# Patient Record
Sex: Female | Born: 1985 | Race: White | Hispanic: No
Health system: Southern US, Community
[De-identification: ages and names within clinical notes are randomized; demographics above are authoritative.]

---

## 2016-12-20 DIAGNOSIS — E785 Hyperlipidemia, unspecified: Secondary | ICD-10-CM | POA: Insufficient documentation

## 2017-03-18 ENCOUNTER — Ambulatory Visit (INDEPENDENT_AMBULATORY_CARE_PROVIDER_SITE_OTHER): Payer: Medicaid Other | Admitting: Licensed Clinical Social Worker

## 2017-03-18 DIAGNOSIS — F4310 Post-traumatic stress disorder, unspecified: Secondary | ICD-10-CM | POA: Diagnosis not present

## 2017-03-19 DIAGNOSIS — F4310 Post-traumatic stress disorder, unspecified: Secondary | ICD-10-CM | POA: Insufficient documentation
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
## 2017-04-04 ENCOUNTER — Ambulatory Visit (INDEPENDENT_AMBULATORY_CARE_PROVIDER_SITE_OTHER): Payer: Medicaid Other | Admitting: Licensed Clinical Social Worker

## 2017-04-04 DIAGNOSIS — F4310 Post-traumatic stress disorder, unspecified: Secondary | ICD-10-CM | POA: Diagnosis not present

## 2017-04-04 NOTE — Progress Notes (Signed)
   THERAPIST PROGRESS NOTE  Session Time: 9:07am-9:58am  Participation Level: Active  Behavioral Response: CasualAlertDysphoric  Type of Therapy: Individual Therapy  Treatment Goals addressed: Improve emotion regulation, increase enjoyment of activities  Interventions: Treatment planning, assessment, solution focused  Suicidal/Homicidal: Denied both  Therapist Interventions:  Had patient complete [facility] PCL-5 to assess for presence and severity of PTSD-related symptoms. Collaborated with patient to develop her treatment plan.  Briefly described interventions she can expect as she participates in therapy. Validated feelings of distress regarding having unexpected problems in her relationship with her fiance.  Discussed how it seems as though he is not being upfront with her about his thoughts and feelings.   Patient indicated she is afraid of her boyfriend kicking her out.  She recently quit working at [employer] job she liked because he asked her to stop working.  Provided her with information about [facility] [place] as [facility] place she can turn to for guidance with finding resources if she needs them.       Summary:  Patient had her two children with her today, Hale 29 year old and Hale baby.  She explained that the daycare they had been going to is no longer in operation.  Hasn't figured out an alternative yet.    Score on the PCL-5 was 31.  Rated three items as bothering her "quite Hale bit" in the past month: Trouble remembering important parts of traumatic events, feeling distant or cut off from others, and trouble falling or staying asleep.     Developed the following treatment goals: Amber Smilingatasha will report feeling satisfied with how she is coping with distressing thoughts and feelings.  She will also report getting some enjoyment from activities.    Patient reported that last night she and her fiance had an argument.  He apparently told her he wanted to separate.  She was very confused  because she was under the impression that everything between them was fine.  She noted that he has Hale history of problems with his mood and even attempted suicide once.  She asked him if he was bipolar.  He told her he didn't want to talk about that.  She wonders if he may need some mental health treatment.  She wasn't able to go into great detail about her concerns because of her 29 year old being present.       Plan: Scheduled to return next week.  Says she is going to work on getting child care.  Diagnosis: PTSD    Darrin LuisSolomon, Amber Sample A, LCSW 04/04/2017

## 2017-04-11 ENCOUNTER — Ambulatory Visit (INDEPENDENT_AMBULATORY_CARE_PROVIDER_SITE_OTHER): Payer: Medicaid Other | Admitting: Licensed Clinical Social Worker

## 2017-04-11 DIAGNOSIS — F4310 Post-traumatic stress disorder, unspecified: Secondary | ICD-10-CM | POA: Diagnosis not present

## 2017-04-14 NOTE — Progress Notes (Signed)
   THERAPIST PROGRESS NOTE  Session Time: 9:05am-9:52am  Participation Level: Active  Behavioral Response: CasualAlertDysphoric Tearful  Type of Therapy: Individual Therapy  Treatment Goals addressed: Improve emotion regulation, increase enjoyment of activities  Interventions: Assessment, solution focused  Suicidal/Homicidal: Denied both  Therapist Interventions:  Discussed concerns patient has about some drastic changes in her fiance's behavior in the past month.  Explored how she might reach out to his parents to talk about these concerns.  Encouraged patient not to blame herself for his behavior.  Educated patient about a relaxation exercise called the 4-7-8 breath.  Provided her with a handout describing the technique.  Recommended practicing it at least once a day.       Summary:   Patient had 3 young children with her because she still does not have a babysitter.  With them present she was not able to fully express her emotions.  She did become tearful but tried to hide this from her daughter.  Reported that her fiance now says he doesn't want to be in a relationship with her anymore.  He became mad at her when she called him out on talking to other women online.  Noted that he has cheated on her twice before.   Expressed concern about the fact that he has been going out drinking with friends on the weekends, he stopped going to church, and stopped talking to his dad.  Meanwhile patient reports not eating in 6 days, waking up in the middle of the night in a panic, and she started smoking again.  Planning to talk at length with fiance's dad because she is hoping he will be able to get his son back on track.      Plan: Scheduled to return Aug 13th.    Diagnosis: PTSD    Darrin LuisSolomon, Sarah A, LCSW 04/11/2017

## 2017-04-21 ENCOUNTER — Ambulatory Visit (HOSPITAL_COMMUNITY): Payer: Medicaid Other | Admitting: Licensed Clinical Social Worker

## 2017-05-02 ENCOUNTER — Ambulatory Visit (HOSPITAL_COMMUNITY): Payer: Medicaid Other | Admitting: Licensed Clinical Social Worker

## 2019-06-29 ENCOUNTER — Ambulatory Visit
Admission: RE | Admit: 2019-06-29 | Discharge: 2019-06-29 | Disposition: A | Discharge status: Home / Self Care | Payer: No Typology Code available for payment source | Source: Ambulatory Visit | Attending: Nurse Practitioner | Admitting: Nurse Practitioner

## 2019-06-29 ENCOUNTER — Other Ambulatory Visit: Payer: Self-pay | Admitting: Nurse Practitioner

## 2019-06-29 DIAGNOSIS — M25511 Pain in right shoulder: Secondary | ICD-10-CM

## 2020-09-19 IMAGING — CR DG SHOULDER 2+V*R*
3 series · 3 of 3 positions shown · non-contrast
Comparison: None.

CLINICAL DATA: Right shoulder pain

EXAM:
RIGHT SHOULDER - 2+ VIEW

[w shoulder grashey right]
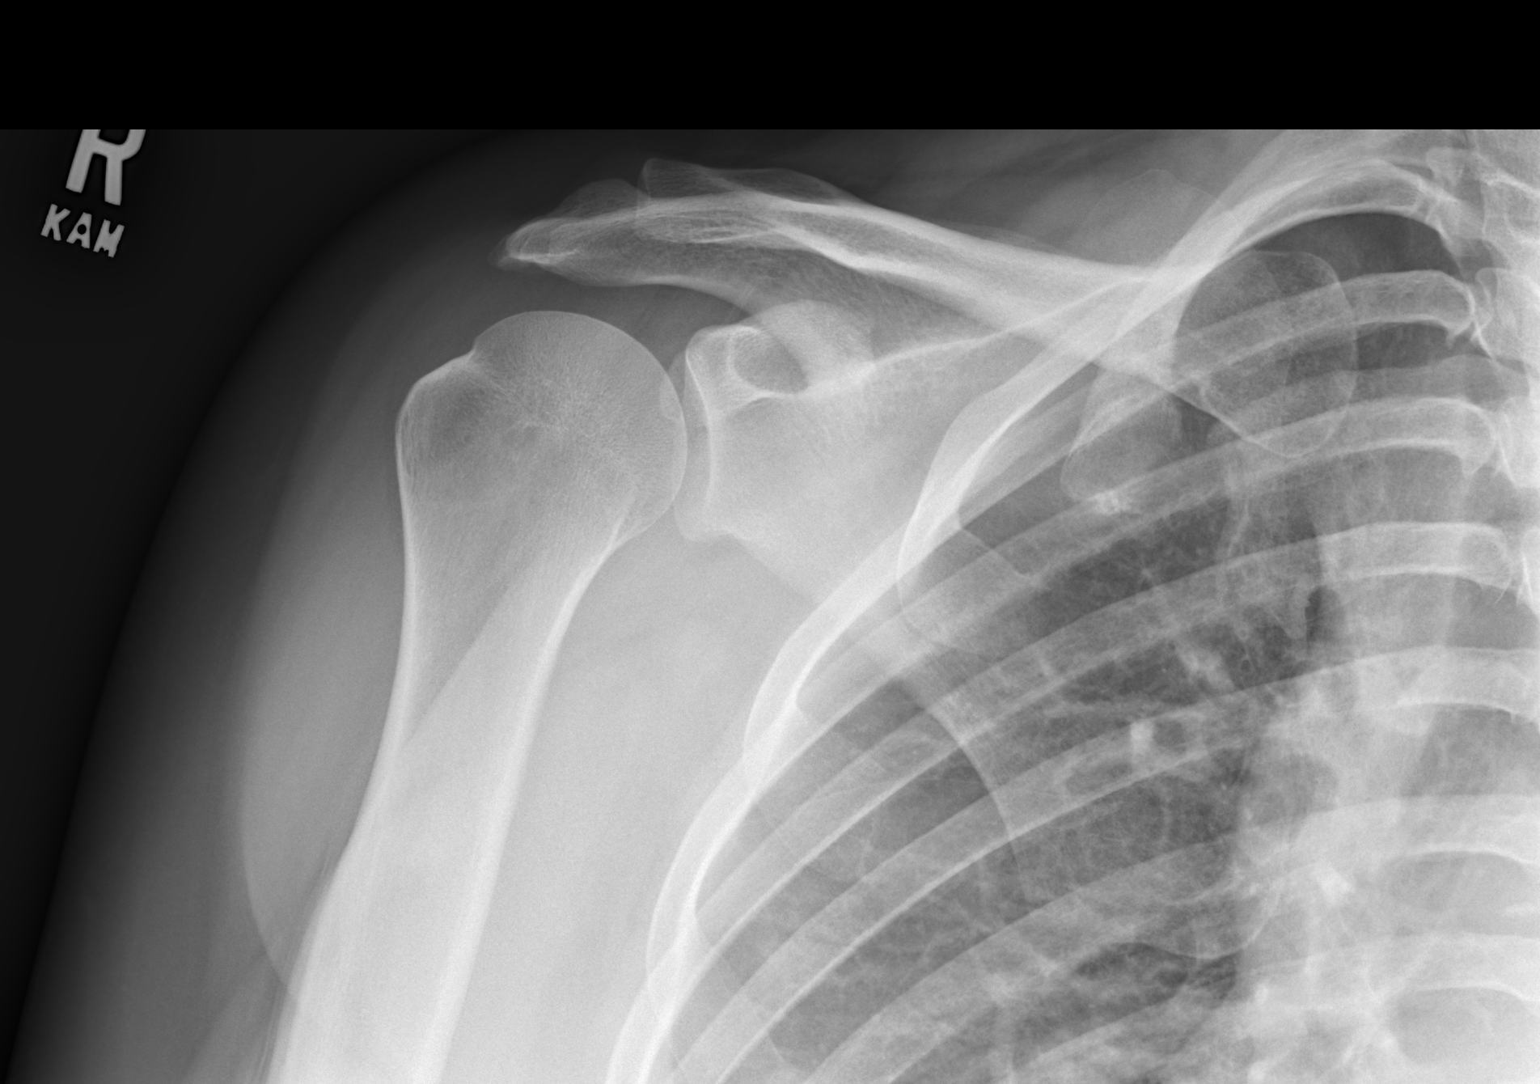

[w shoulder y-view right]
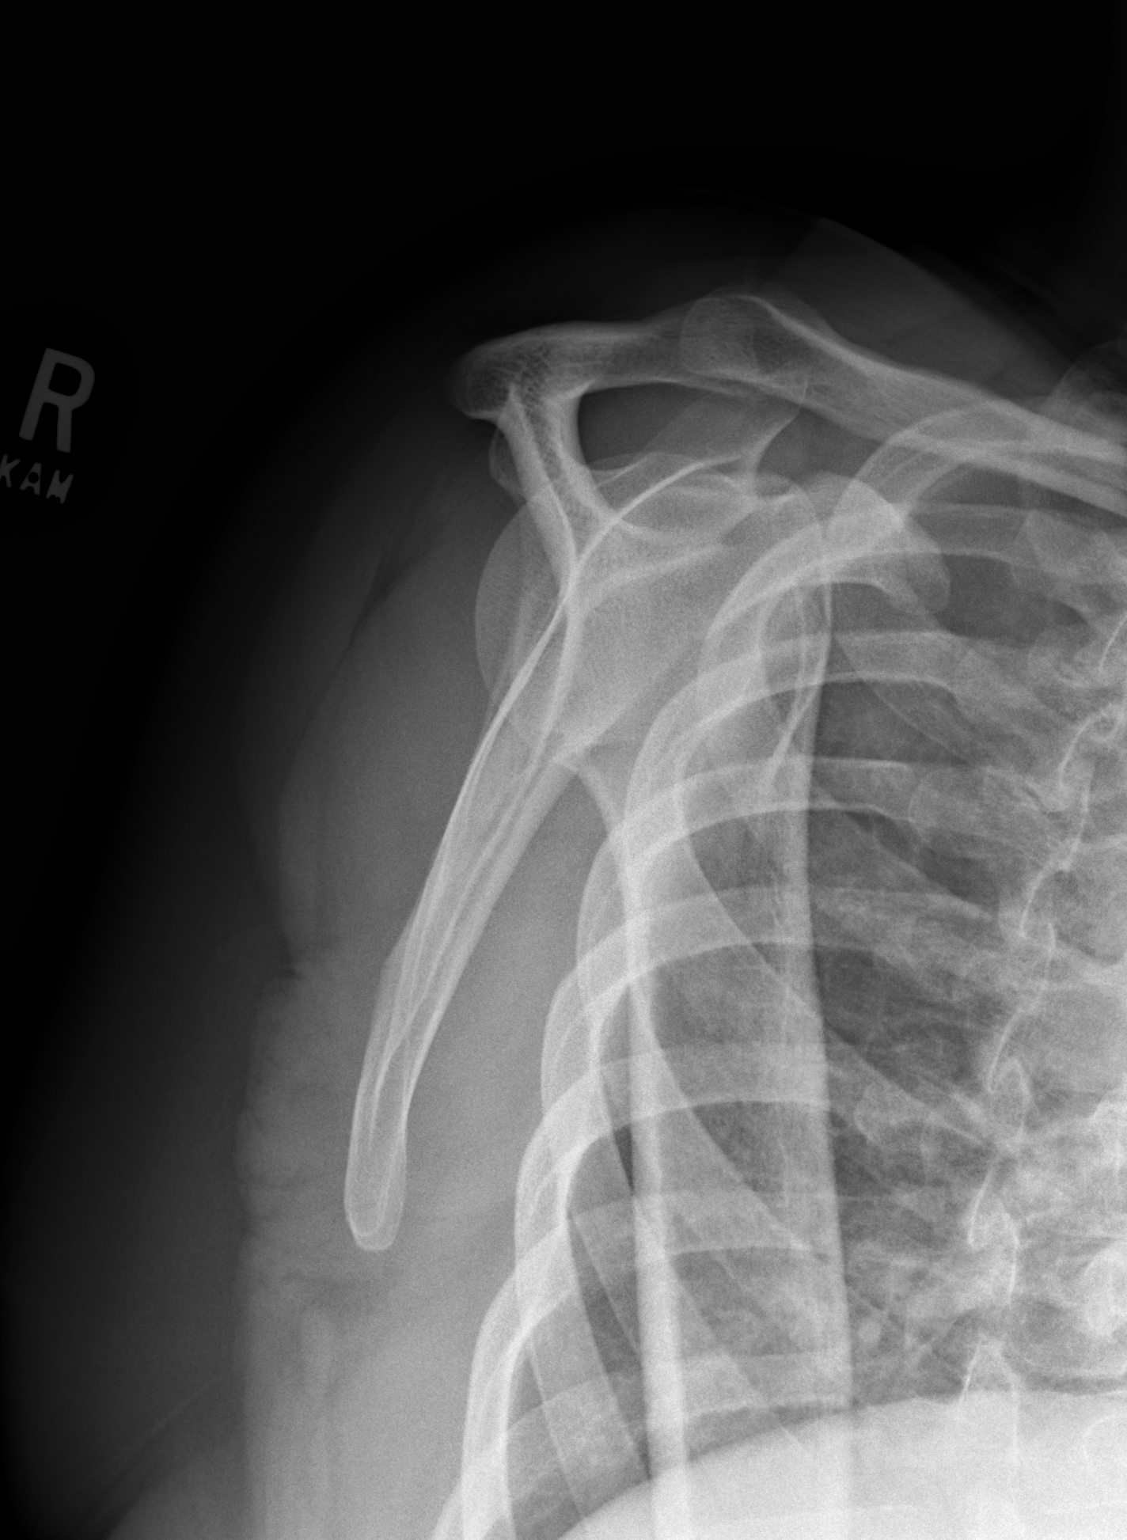

[x shoulder axillary right]
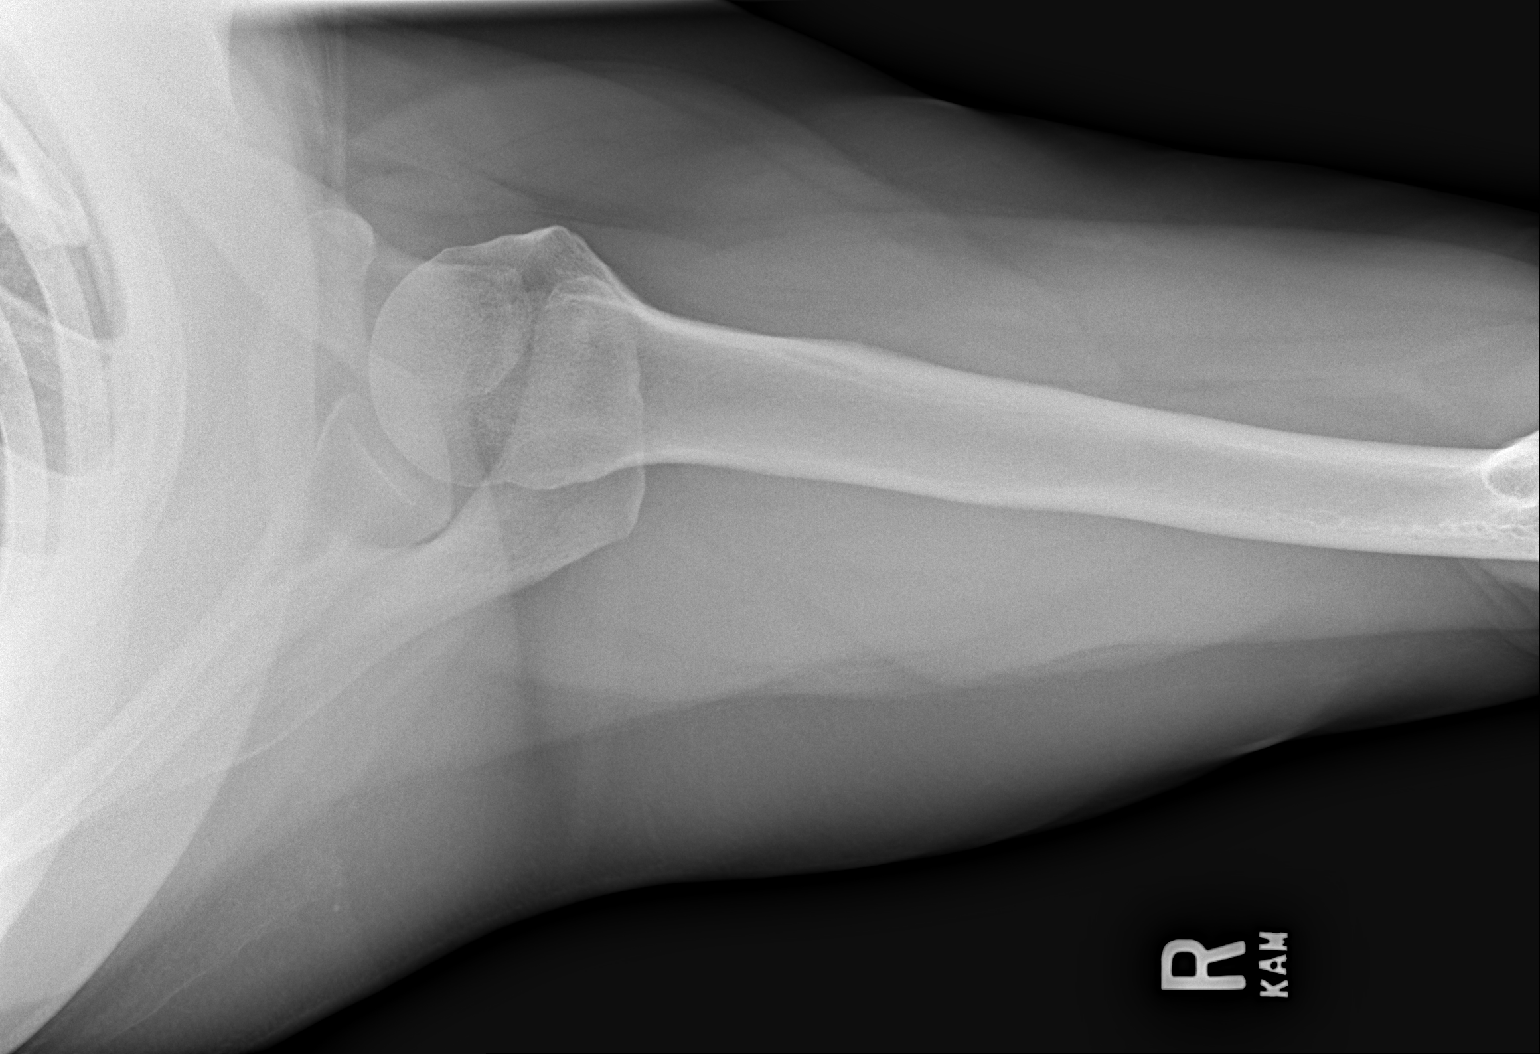

[3 of 3 positions shown; findings below may reference images not displayed]

FINDINGS: There is no evidence of fracture or dislocation. There is no
evidence of arthropathy or other focal bone abnormality. Soft
tissues are unremarkable.
IMPRESSION: Negative.

## 2021-08-07 ENCOUNTER — Emergency Department (HOSPITAL_COMMUNITY): Payer: No Typology Code available for payment source

## 2021-08-07 ENCOUNTER — Emergency Department (HOSPITAL_COMMUNITY)
Admission: EM | Admit: 2021-08-07 | Discharge: 2021-08-08 | Disposition: A | Discharge status: Home / Self Care | Payer: No Typology Code available for payment source | Attending: Emergency Medicine | Admitting: Emergency Medicine

## 2021-08-07 DIAGNOSIS — Z5321 Procedure and treatment not carried out due to patient leaving prior to being seen by health care provider: Secondary | ICD-10-CM | POA: Insufficient documentation

## 2021-08-07 DIAGNOSIS — M25561 Pain in right knee: Secondary | ICD-10-CM | POA: Insufficient documentation

## 2021-08-07 DIAGNOSIS — M25562 Pain in left knee: Secondary | ICD-10-CM | POA: Insufficient documentation

## 2021-08-07 DIAGNOSIS — Y92410 Unspecified street and highway as the place of occurrence of the external cause: Secondary | ICD-10-CM | POA: Insufficient documentation
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
## 2021-08-08 NOTE — ED Notes (Signed)
Pt called x4 for room assigned. No answer.

## 2022-08-30 ENCOUNTER — Ambulatory Visit (HOSPITAL_COMMUNITY)
Admission: RE | Admit: 2022-08-30 | Discharge: 2022-08-30 | Disposition: A | Discharge status: Home / Self Care | Payer: No Typology Code available for payment source | Source: Ambulatory Visit | Attending: Vascular Surgery | Admitting: Vascular Surgery

## 2022-08-30 ENCOUNTER — Other Ambulatory Visit (HOSPITAL_COMMUNITY): Payer: Self-pay | Admitting: Orthopedic Surgery

## 2022-08-30 DIAGNOSIS — M79604 Pain in right leg: Secondary | ICD-10-CM

## 2022-11-29 ENCOUNTER — Ambulatory Visit (HOSPITAL_COMMUNITY)
Admission: EM | Admit: 2022-11-29 | Discharge: 2022-11-29 | Disposition: A | Discharge status: Home / Self Care | Payer: 59 | Attending: Behavioral Health | Admitting: Behavioral Health

## 2022-11-29 DIAGNOSIS — F323 Major depressive disorder, single episode, severe with psychotic features: Secondary | ICD-10-CM | POA: Diagnosis not present

## 2022-11-29 MED ORDER — ESCITALOPRAM OXALATE 10 MG PO TABS: 10.0000 mg | ORAL_TABLET | Freq: Every day | ORAL | 0 refills | Status: AC
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
## 2022-11-29 NOTE — Discharge Instructions (Addendum)
Discharge recommendations:   Medications: Patient is to take medications as prescribed. The patient or patient's guardian is to contact a medical professional and/or outpatient provider to address any new side effects that develop. The patient or the patient's guardian should update outpatient providers of any new medications and/or medication changes.   Outpatient Follow up: Please review list of outpatient resources for psychiatry and counseling. Please follow up with your primary care provider for all medical related needs.   Therapy: We recommend that patient participate in individual therapy to address mental health concerns.  Safety:   The following safety precautions should be taken:   No sharp objects. This includes scissors, razors, scrapers, and putty knives.   Chemicals should be removed and locked up.   Medications should be removed and locked up.   Weapons should be removed and locked up. This includes firearms, knives and instruments that can be used to cause injury.   The patient should abstain from use of illicit substances/drugs and abuse of any medications.  If symptoms worsen or do not continue to improve or if the patient becomes actively suicidal or homicidal then it is recommended that the patient return to the closest hospital emergency department, the [facility], or call 911 for further evaluation and treatment. National Suicide Prevention Lifeline 1-800-SUICIDE or (520)784-8003.  About 988 988 offers 24/7 access to trained crisis counselors who can help people experiencing mental health-related distress. People can call or text 988 or chat 988lifeline.org for themselves or if they are worried about a loved one who may need crisis support.   Please contact one of the following facilities to start medication management and therapy services:   [facility] at [place] [address]  [place], [place]  [address] [phone]   [place]  [address] [place], [place] [address] [phone]  [place]  [address] [place], [place] [address] [phone]  [facility]  [address] [place], [place] [address] [phone]  [facility]  [address] [place], [place] [address] [phone]  [place]  [address],  [place], [place] [address] [phone]  Please present to one of the following facilities to start medication management and therapy services:   [facility] at [place] [address]  [place], [place] [address] [phone]

## 2024-05-14 ENCOUNTER — Telehealth (INDEPENDENT_AMBULATORY_CARE_PROVIDER_SITE_OTHER): Payer: Self-pay

## 2024-05-14 NOTE — Telephone Encounter (Signed)
 Per Delon Sables, Site Lead, patient will enter through side Employee entrance for appt on 06/18/2024.  I called [facility] at [phone] and spoke with Bridgette and told her which entrance to bring the patient to once they arrive for the appt.  She made a note of it in their file.

## 2024-06-16 NOTE — Progress Notes (Unsigned)
  [address], [address] [place], [place] [address] [phone]  Audiological Evaluation    Name: Amber Hale     DOB:   Apr 25, 1986      MRN:   [id number]                                                                                     Service Date: 06/16/2024     Accompanied by: two deputies   Patient comes today after Dr. Penne Croak, DO sent a referral for a hearing evaluation due to concerns with recurrent ear infections.   Symptoms Yes Details  Hearing loss  [x]  Reports left muffled hearing  Tinnitus  [x]  Reports some tinnitus  Ear pain/ infections/pressure  [x]  Reports left ear is not draining and is having multiple ear infections and sinus infections. Reports a little pain in the left ear.  Balance problems  [x]  Reports balance issues go away when she is prescribed with antibiotics.  Noise exposure history  []    Previous ear surgeries  []    Family history of hearing loss  []    Amplification  []    Other  [x]  Also reports has been having chest pains.    Otoscopy: Right ear: Clear external ear canal and notable landmarks visualized on the tympanic membrane. Left ear:  Clear external ear canal and notable landmarks visualized on the tympanic membrane.  Tympanometry: Right ear: Type A- Normal external ear canal volume with normal middle ear pressure and tympanic membrane compliance. Left ear: Type A- Normal external ear canal volume with normal middle ear pressure and tympanic membrane compliance.  Pure tone Audiometry: Right ear- Normal hearing from [phone] Hz.    Left ear-  Normal to borderline normal hearing from 815 717 5655 Hz.     Speech Audiometry: Right ear- Speech Reception Threshold (SRT) was obtained at 5 dBHL. Left ear-Speech Reception Threshold (SRT) was obtained at 5 dBHL.   Word Recognition Score Tested using NU-6 (recorded) Right ear: 100% was obtained at a presentation level of 50 dBHL with contralateral masking which is deemed as   excellent. Left ear: 100% was obtained at a presentation level of 50 dBHL with contralateral masking which is deemed as  excellent.   The hearing test results were completed under headphones and results are deemed to be of good reliability. Test technique:  conventional      Recommendations: Follow up with ENT as scheduled for today. Return for a hearing evaluation if concerns with hearing changes arise or per MD recommendation.   Amber Hale, AUD

## 2024-06-18 ENCOUNTER — Encounter (INDEPENDENT_AMBULATORY_CARE_PROVIDER_SITE_OTHER): Payer: Self-pay

## 2024-06-18 ENCOUNTER — Ambulatory Visit (INDEPENDENT_AMBULATORY_CARE_PROVIDER_SITE_OTHER): Payer: Self-pay

## 2024-06-18 ENCOUNTER — Ambulatory Visit (INDEPENDENT_AMBULATORY_CARE_PROVIDER_SITE_OTHER): Admitting: Audiology

## 2024-06-18 VITALS — BP 136/78 | HR 73 | Temp 98.4°F | Ht 65.5 in | Wt 242.0 lb

## 2024-06-18 DIAGNOSIS — R0981 Nasal congestion: Secondary | ICD-10-CM | POA: Diagnosis not present

## 2024-06-18 DIAGNOSIS — J329 Chronic sinusitis, unspecified: Secondary | ICD-10-CM

## 2024-06-18 DIAGNOSIS — J343 Hypertrophy of nasal turbinates: Secondary | ICD-10-CM | POA: Diagnosis not present

## 2024-06-18 DIAGNOSIS — H9313 Tinnitus, bilateral: Secondary | ICD-10-CM

## 2024-06-18 DIAGNOSIS — J342 Deviated nasal septum: Secondary | ICD-10-CM

## 2024-06-18 DIAGNOSIS — Z0110 Encounter for examination of ears and hearing without abnormal findings: Secondary | ICD-10-CM

## 2024-06-18 DIAGNOSIS — H93292 Other abnormal auditory perceptions, left ear: Secondary | ICD-10-CM

## 2024-06-18 MED ORDER — FLUTICASONE PROPIONATE 50 MCG/ACT NA SUSP: 2.0000 | Freq: Every day | NASAL | 6 refills | Status: DC

## 2024-06-20 NOTE — Progress Notes (Signed)
 Dear Dr. Lang, Here is my assessment for our mutual patient, Amber Hale. Thank you for allowing me the opportunity to care for your patient. Please do not hesitate to contact me should you have any other questions. Sincerely, Dr. Penne Croak  Otolaryngology Clinic Note Referring provider: Dr. Lang HPI:  Discussed the use of AI scribe software for clinical note transcription with the patient, who gave verbal consent to proceed.  History of Present Illness Amber Hale is a 38 year old female who presents with recurrent sinus and ear infections.  Recurrent sinus and ear infections - Recurrent sinus and ear infections since March 2025 - Sensation of fluid not draining properly, particularly under the jaw, with associated swelling - Persistent symptoms despite multiple courses of antibiotics, antihistamines, and steroids  Nasal congestion - Significant and persistent nasal congestion - Worsens when lying on right side, causing a sensation of 'drowning' - No current use of intranasal steroids; previously used Flonase in middle school with good effect  Otologic symptoms - Slight hearing loss in the left ear - Hearing described as 'underwater' on the affected side - Occasional ringing (tinnitus) and a 'whoosh' sound in the left ear, triggered by loud noises - No history of ear surgery or tympanostomy tubes  Headache and chest pain - Headaches and chest pain onset approximately one month ago  Dental history - No dental evaluation or cleaning in the past year and a half - No dental issues  Independent Review of Additional Tests or Records:  Reviewed external note from referring PCP, Amber Hale,describing No chief complaint on file. . Relevant history incorporated into today's evaluation.  Been on cirpo drops x 2 for ears Been on 3 PO antibiotic courses. Medrol dose pac, and zyrtec  Audiogram 100% word rec bilaterally. Hearing bilaterally WNL, mildly worse hearing on the  left at 8K.  Type A tymps AU.    PMH/Meds/All/SocHx/FamHx/ROS:  History reviewed. No pertinent past medical history.   History reviewed. No pertinent surgical history.  History reviewed. No pertinent family history.   Social Connections: Unknown (01/19/2022)   Received from [facility]   Social Network    Social Network: Not on file      Current Outpatient Medications:    cetirizine (ZYRTEC) 10 MG tablet, Take 10 mg by mouth daily., Disp: , Rfl:    hydrOXYzine (VISTARIL) 50 MG capsule, Take 50 mg by mouth 3 (three) times daily as needed., Disp: , Rfl:    prazosin (MINIPRESS) 2 MG capsule, Take 2 mg by mouth at bedtime., Disp: , Rfl:    escitalopram  (LEXAPRO ) 10 MG tablet, Take 1 tablet (10 mg total) by mouth daily., Disp: 30 tablet, Rfl: 0   fluticasone (FLONASE) 50 MCG/ACT nasal spray, Place 2 sprays into both nostrils daily., Disp: 16 g, Rfl: 6   Multiple Vitamin (MULTI-VITAMIN DAILY PO), Take by mouth., Disp: , Rfl:    Physical Exam:   BP 136/78 (BP Location: Left Arm, Patient Position: Sitting, Cuff Size: Large)   Pulse 73   Temp 98.4 F (36.9 C) (Oral)   Ht 5' 5.5 (1.664 m)   Wt 242 lb (109.8 kg)   SpO2 97%   BMI 39.66 kg/m   The patient was awake, alert, and appropriate. The external ears were inspected, and otoscopy was performed to evaluate the external auditory canals and tympanic membranes. The nasal cavity and septum were examined for mucosal changes, obstruction, or discharge. The oral cavity and oropharynx were inspected for mucosal lesions, infection, or tonsillar hypertrophy.  The neck was palpated for lymphadenopathy, thyroid abnormalities, or other masses. Cranial nerve function was grossly intact.  Pertinent Findings: Physical Exam HEENT: No fluid in ears. Nasal septum deviated to the right with crusting present in nasal cavity. No signs of sinus infection. Oral cavity and throat normal.   Seprately Identifiable Procedures:  I personally ordered,  reviewed and interpreted the following with the patient today  Given the patient's symptoms and incomplete visualization of critical sinonasal areas with anterior rhinoscopy, a separately performed diagnostic nasal endoscopy procedure is indicated for a complete rhinologic evaluation per American Rhinologic Society recommendations (https://www.american-rhinologic.org/position-statements)  I personally ordered, reviewed and interpreted the following with the patient today  Procedure Note Diagnostic Nasal Endoscopy CPT CODE -- 68768 - Mod 25  Prior to initiating any procedures, risks/benefits/alternatives were explained to the patient and verbal consent obtained.  Pre-procedure diagnosis: Concern for obstruction/mass Post-procedure diagnosis: same Indication: See pre-procedure diagnosis and physical exam above Complications: None apparent EBL: 0 mL Anesthesia: Lidocaine 4% and topical decongestant was topically sprayed in each nasal cavity  Description of Procedure:  Patient was identified. A flexible fiberoptic endoscope was utilized to evaluate the sinonasal cavities, mucosa, sinus ostia and turbinates and septum.  Overall, signs of mucosal inflammation are noted.  Also noted are DNS.  No mucopurulence, polyps, or masses noted.   Right Middle meatus: crusting Right SE Recess: congested Left MM: congested Left SE Recess: congested Photodocumentation was obtained.  Impression & Plans:  Amber Hale is a 38 y.o. female  1. Nasal congestion   2. Sinusitis, unspecified chronicity, unspecified location   3. Deviated nasal septum   4. Hypertrophy of inferior nasal turbinate   5. Tinnitus of both ears     - Findings and diagnoses discussed in detail with the patient. - Risks, benefits, and alternatives were reviewed. Through shared decision making, the patient elects to proceed with below.  Assessment and Plan Assessment & Plan Chronic nasal congestion with deviated septum Chronic  nasal congestion due to right-sided deviated septum causing nasal obstruction, worsened by positional changes. - Initiate Flonase nasal spray daily, directing spray to the side of the nose to reduce swelling and improve nasal airflow. - Educated on potential epistaxis with Flonase use; recommend Vaseline ointment if epistaxis occurs. - Discussed potential surgical intervention to straighten the septum if symptoms persist despite medical management.  Recurrent sinus symptoms, rule out chronic sinusitis Recurrent sinus symptoms with temporary relief from antibiotics, no acute infection signs, possible chronic sinusitis. - Order CT scan of the sinuses to evaluate for chronic sinusitis. - Follow up after CT scan results to determine further management.  Left-sided sensorineural hearing loss with tinnitus and sound sensitivity Slight left-sided sensorineural hearing loss with tinnitus and sound sensitivity, possibly due to muscle tensing from loud noises. Benign ear exam.   - Monitor hearing loss and tinnitus - recommend white noise machine.   - Orders placed:  Orders Placed This Encounter  Procedures   CT SINUS WO CONTRAST   - Medications prescribed/continued/adjusted:  Meds ordered this encounter  Medications   DISCONTD: fluticasone (FLONASE) 50 MCG/ACT nasal spray    Sig: Place 2 sprays into both nostrils daily.    Dispense:  16 g    Refill:  6   fluticasone (FLONASE) 50 MCG/ACT nasal spray    Sig: Place 2 sprays into both nostrils daily.    Dispense:  16 g    Refill:  6   - Education materials provided to the patient. - Follow up:  6-8 weeks. Patient instructed to return sooner or go to the ED if new/worsening symptoms develop.  Thank you for allowing me the opportunity to care for your patient. Please do not hesitate to contact me should you have any other questions.  Sincerely, Penne Croak, DO Otolaryngologist (ENT) [facility] Phone: [phone] Fax:  [phone]  06/20/2024, 1:33 PM

## 2024-07-02 ENCOUNTER — Telehealth (INDEPENDENT_AMBULATORY_CARE_PROVIDER_SITE_OTHER): Payer: Self-pay

## 2024-07-02 NOTE — Telephone Encounter (Signed)
 Spoke with Idell GLADE LPN , from [facility] [phone]) regarding making a follow up appointment to review patient's   sinus CT on 07/09/24 at 4:15 pm with Dr. Anice.

## 2024-07-09 ENCOUNTER — Encounter (INDEPENDENT_AMBULATORY_CARE_PROVIDER_SITE_OTHER): Payer: Self-pay

## 2024-07-09 ENCOUNTER — Ambulatory Visit (INDEPENDENT_AMBULATORY_CARE_PROVIDER_SITE_OTHER)

## 2024-07-09 VITALS — BP 171/119 | HR 75

## 2024-07-09 DIAGNOSIS — J302 Other seasonal allergic rhinitis: Secondary | ICD-10-CM | POA: Diagnosis not present

## 2024-07-09 DIAGNOSIS — J358 Other chronic diseases of tonsils and adenoids: Secondary | ICD-10-CM

## 2024-07-09 DIAGNOSIS — J342 Deviated nasal septum: Secondary | ICD-10-CM

## 2024-07-09 DIAGNOSIS — J343 Hypertrophy of nasal turbinates: Secondary | ICD-10-CM

## 2024-07-09 DIAGNOSIS — R0981 Nasal congestion: Secondary | ICD-10-CM | POA: Diagnosis not present

## 2024-07-09 MED ORDER — CHLORHEXIDINE GLUCONATE 0.12 % MT SOLN: 15.0000 mL | OROMUCOSAL | 0 refills | Status: AC | PRN

## 2024-07-09 MED ORDER — CHLORHEXIDINE GLUCONATE 0.12 % MT SOLN: 15.0000 mL | Freq: Two times a day (BID) | OROMUCOSAL | 0 refills | Status: DC

## 2024-07-09 MED ORDER — SINUS RINSE BOTTLE KIT NA PACK: 1.0000 | PACK | Freq: Every day | NASAL | 1 refills | Status: AC

## 2024-07-09 NOTE — Progress Notes (Signed)
 Dear Dr. Deidre, Here is my assessment for our mutual patient, Paysen Goza. Thank you for allowing me the opportunity to care for your patient. Please do not hesitate to contact me should you have any other questions. Sincerely, Dr. Penne Croak  Otolaryngology Clinic Note Referring provider: Dr. Deidre HPI:  Discussed the use of AI scribe software for clinical note transcription with the patient, who gave verbal consent to proceed.  History of Present Illness Heatherly Stenner is a 38 year old female who presents with nasal congestion and tonsil stones.  Nasal congestion and rhinorrhea - Nasal congestion with clear and yellow discharge, primarily on the left side - Nasal spray has alleviated clear discharge but not yellow discharge - Sensation of swelling has decreased but now feels like a 'rock' in the left cheek, particularly noticeable when swallowing, located above the tonsil area  Tonsillolithiasis (tonsil stones) - Tonsil stones more prominent on the left side than the right - Previously manually removed tonsil stones - Seeking a mouth rinse to help manage tonsil stones  Headache and migraine symptoms - Headaches occur a couple of times per week - History of migraines, previously managed effectively with propranolol 20 mg - No daily headaches  Otologic symptoms - No current ear symptoms   Independent Review of Additional Tests or Records:  Reviewed external note from referring PCP, Practice,describing relevant history incorporated into today's evaluation. I personally reviewed and interpreted ct Sinus - all sinuses clear. Significant deviated septum.   PMH/Meds/All/SocHx/FamHx/ROS:  No past medical history on file.   No past surgical history on file.  No family history on file.   Social Connections: Unknown (01/19/2022)   Received from [facility]   Social Network    Social Network: Not on file      Current Outpatient Medications:    cetirizine (ZYRTEC) 10  MG tablet, Take 10 mg by mouth daily., Disp: , Rfl:    escitalopram  (LEXAPRO ) 10 MG tablet, Take 1 tablet (10 mg total) by mouth daily., Disp: 30 tablet, Rfl: 0   fluticasone (FLONASE) 50 MCG/ACT nasal spray, Place 2 sprays into both nostrils daily., Disp: 16 g, Rfl: 6   Hypertonic Nasal Wash (SINUS RINSE BOTTLE KIT) PACK, Place 1 each into the nose daily., Disp: 1 each, Rfl: 1   Multiple Vitamin (MULTI-VITAMIN DAILY PO), Take by mouth., Disp: , Rfl:    prazosin (MINIPRESS) 2 MG capsule, Take 2 mg by mouth at bedtime., Disp: , Rfl:    chlorhexidine (PERIDEX) 0.12 % solution, Use as directed 15 mLs in the mouth or throat as needed for up to 7 days. Tonsil stones, Disp: 210 mL, Rfl: 0   Physical Exam:   BP (!) 171/119   Pulse 75   SpO2 96%   The patient was awake, alert, and appropriate. The external ears were inspected, and otoscopy was performed to evaluate the external auditory canals and tympanic membranes. The nasal cavity and septum were examined for mucosal changes, obstruction, or discharge. The oral cavity and oropharynx were inspected for mucosal lesions, infection, or tonsillar hypertrophy. The neck was palpated for lymphadenopathy, thyroid abnormalities, or other masses. Cranial nerve function was grossly intact.  Pertinent Findings: Physical Exam HEENT: Nose normal, throat normal Cryptic tonsils DNS right Turbinate hypertrophy EAC and TM normal  Seprately Identifiable Procedures:  I personally ordered, reviewed and interpreted the following with the patient today  Given the patient's symptoms and incomplete visualization of critical sinonasal areas with anterior rhinoscopy, a separately performed diagnostic nasal endoscopy  procedure is indicated for a complete rhinologic evaluation per American Rhinologic Society recommendations ([email or web address]://[email or web address]-[email or web address]/position-statements)  I personally ordered, reviewed and interpreted the following with the patient  today  Procedure Note Diagnostic Nasal Endoscopy CPT CODE -- 68768 - Mod 25  Prior to initiating any procedures, risks/benefits/alternatives were explained to the patient and verbal consent obtained.  Pre-procedure diagnosis: Concern for sinus infection Post-procedure diagnosis: same Indication: See pre-procedure diagnosis and physical exam above Complications: None apparent EBL: 0 mL Anesthesia: Lidocaine 4% and topical decongestant was topically sprayed in each nasal cavity  Description of Procedure:  Patient was identified. A flexible fiberoptic endoscope was utilized to evaluate the sinonasal cavities, mucosa, sinus ostia and turbinates and septum.  Overall, signs of mucosal inflammation are noted.  Also noted are right DNS.  No mucopurulence, polyps, or masses noted.   Right Middle meatus: congested Right SE Recess: clear Left MM: congested Left SE Recess: clear Photodocumentation was obtained.   Impression & Plans:  Selam Pietsch is a 38 y.o. female  1. Tonsil stone   2. Seasonal allergic rhinitis, unspecified trigger   3. DNS (deviated nasal septum)   4. Hypertrophy of inferior nasal turbinate   5. Nasal congestion    - Findings and diagnoses discussed in detail with the patient. - Risks, benefits, and alternatives were reviewed. Through shared decision making, the patient elects to proceed with below.  Assessment & Plan Left-sided nasal congestion and purulent discharge Persistent congestion with purulent discharge. Nasal spray reduced clear discharge but not yellow discharge. Swelling decreased, sensation of rock in cheek persists.  - start nasal rinse, continue flonase and hold vistaril. Continue zrytec. No infection on nasal endo  Left tonsil stones Complained of left tonsil stones more than right. Previously removed stones manually.   Consider follow up with neurology   - Orders placed: No orders of the defined types were placed in this encounter.  -  Medications prescribed/continued/adjusted:  Meds ordered this encounter  Medications   Hypertonic Nasal Wash (SINUS RINSE BOTTLE KIT) PACK    Sig: Place 1 each into the nose daily.    Dispense:  1 each    Refill:  1   DISCONTD: chlorhexidine (PERIDEX) 0.12 % solution    Sig: Use as directed 15 mLs in the mouth or throat 2 (two) times daily for 7 days.    Dispense:  210 mL    Refill:  0   chlorhexidine (PERIDEX) 0.12 % solution    Sig: Use as directed 15 mLs in the mouth or throat as needed for up to 7 days. Tonsil stones    Dispense:  210 mL    Refill:  0   - Education materials provided to the patient. - Follow up: 3 months. Patient instructed to return sooner or go to the ED if new/worsening symptoms develop.   Thank you for allowing me the opportunity to care for your patient. Please do not hesitate to contact me should you have any other questions.  Sincerely, Penne Croak, DO Otolaryngologist (ENT) [facility] Phone: [phone] Fax: [phone]  07/09/2024, 5:47 PM   I spent 62 minutes (exclusive of separately billable procedures) on the day of the encounter reviewing the patient's chart, seeing the patient face to face, discussing the findings and the treatment plan, and documenting in the EHR.

## 2024-10-13 ENCOUNTER — Ambulatory Visit (INDEPENDENT_AMBULATORY_CARE_PROVIDER_SITE_OTHER)

## 2024-10-13 VITALS — BP 135/86 | HR 95 | Wt 242.0 lb

## 2024-10-13 DIAGNOSIS — J358 Other chronic diseases of tonsils and adenoids: Secondary | ICD-10-CM | POA: Diagnosis not present

## 2024-10-13 DIAGNOSIS — J343 Hypertrophy of nasal turbinates: Secondary | ICD-10-CM

## 2024-10-13 DIAGNOSIS — H6990 Unspecified Eustachian tube disorder, unspecified ear: Secondary | ICD-10-CM

## 2024-10-13 DIAGNOSIS — J342 Deviated nasal septum: Secondary | ICD-10-CM | POA: Diagnosis not present

## 2024-10-13 DIAGNOSIS — R070 Pain in throat: Secondary | ICD-10-CM | POA: Diagnosis not present

## 2024-10-13 DIAGNOSIS — R0981 Nasal congestion: Secondary | ICD-10-CM | POA: Diagnosis not present

## 2024-10-13 DIAGNOSIS — J302 Other seasonal allergic rhinitis: Secondary | ICD-10-CM

## 2024-10-13 MED ORDER — CHLORHEXIDINE GLUCONATE 0.12 % MT SOLN: 15.0000 mL | Freq: Every day | OROMUCOSAL | 0 refills | Status: AC

## 2024-10-13 MED ORDER — SALINE SPRAY 0.65 % NA SOLN: 1.0000 | Freq: Three times a day (TID) | NASAL | 1 refills | Status: AC

## 2024-10-13 MED ORDER — FLUTICASONE PROPIONATE 50 MCG/ACT NA SUSP: 2.0000 | Freq: Two times a day (BID) | NASAL | 0 refills | Status: AC

## 2024-10-13 NOTE — Patient Instructions (Signed)
" °  VISIT SUMMARY: During your visit, we discussed your persistent left-sided nasal, throat, and ear symptoms. We reviewed your sinonasal, oropharyngeal, and otologic symptoms in detail and performed a thorough evaluation.  YOUR PLAN: -EUSTACHIAN TUBE DYSFUNCTION: Eustachian tube dysfunction occurs when the tube connecting the middle ear to the back of the nose does not open or close properly, causing ear pain and pressure. Your audiometry results are normal, and there are no concerning findings. We will continue to monitor your symptoms.  -TONSILLOLITHIASIS: Tonsillolithiasis refers to the formation of small stones in the tonsils. You do not currently have any tonsil stones. We discussed the ongoing use of chlorhexidine  rinse and manual expression to manage your symptoms. A prescription for additional chlorhexidine  rinse is available if needed. Tonsillectomy is not recommended unless absolutely necessary.  -DEVIATED NASAL SEPTUM: A deviated nasal septum means that the thin wall between your nasal passages is displaced to one side, which can contribute to nasal congestion. This was noted during your nasal endoscopy.  -NASAL CONGESTION WITH TURBINATE HYPERTROPHY: Nasal congestion with turbinate hypertrophy involves the swelling of the turbinates, which are structures inside the nose that help filter and humidify the air. You do not have acute rhinosinusitis or significant obstruction. We recommend increasing your fluticasone  nasal spray to twice daily and using saline spray up to hourly during severe congestion. Continue with saline irrigation and fluticasone .  -SEASONAL ALLERGIC RHINITIS: Seasonal allergic rhinitis is an allergic reaction to pollen and other allergens that causes nasal congestion, sneezing, and runny nose. Your condition is well-managed with daily cetirizine and fluticasone  nasal spray. Continue using these medications daily.  INSTRUCTIONS: Please follow up as needed if your symptoms  persist or worsen. Continue with your current treatment plan and the adjustments we discussed today.    Contains text generated by [employer].   "

## 2024-10-13 NOTE — Progress Notes (Signed)
 Dear Dr. Deidre, Here is my assessment for our mutual patient, Amber Hale. Thank you for allowing me the opportunity to care for your patient. Please do not hesitate to contact me should you have any other questions. Sincerely, Dr. Penne Croak  Otolaryngology Clinic Note Referring provider: Dr. Deidre HPI:  Discussed the use of AI scribe software for clinical note transcription with the patient, who gave verbal consent to proceed.  History of Present Illness Amber Hale is a 39 year old female who presents with nasal congestion and tonsil stones.  Nasal congestion and rhinorrhea - Nasal congestion with clear and yellow discharge, primarily on the left side - Nasal spray has alleviated clear discharge but not yellow discharge - Sensation of swelling has decreased but now feels like a 'rock' in the left cheek, particularly noticeable when swallowing, located above the tonsil area  Tonsillolithiasis (tonsil stones) - Tonsil stones more prominent on the left side than the right - Previously manually removed tonsil stones - Seeking a mouth rinse to help manage tonsil stones  Headache and migraine symptoms - Headaches occur a couple of times per week - History of migraines, previously managed effectively with propranolol 20 mg - No daily headaches  Otologic symptoms - No current ear symptoms  Interval hx Amber Hale is a 39 year old female with recurrent sinonasal and oropharyngeal complaints who presents for evaluation of persistent left-sided nasal, throat, and ear symptoms.  Sinonasal Symptoms: - Persistent left-sided nasal symptoms for two months, including sensation of a lump and abnormal liquid drainage from the left nasal passage - Intermittent pain and pressure localized to the left nasal region - Difficulty achieving adequate drainage, requiring repeated attempts to clear the area - Symptoms most pronounced at night and upon awakening - Transient improvement for  several hours following use of saline nasal spray - No colored nasal drainage or changes in sense of smell - Treated with antibiotics for sinus infection two months ago with initial resolution, but symptoms recurred approximately two weeks later - Currently using saline nasal spray as needed and fluticasone  nasal spray once daily - No formal sinus rinses performed  Oropharyngeal Symptoms: - Persistent oropharyngeal symptoms for two months, including sensation of a lump and intermittent pain - Recent episode of significant throat swelling with severe odynophagia and limitation of neck mobility, associated with severe ear pressure for three days before resolving - Ongoing mild throat discomfort, particularly with swallowing - History of frequent tonsillitis and sinus infections during childhood - No recent tonsil stones - Familiar with manual expression of tonsil stones and maintains attentive oral hygiene - Completed a fourteen-day course of chlorhexidine  rinse and interested in daily use for oral care and tonsillolithiasis management - No fevers, chills, or other systemic symptoms  Otologic Symptoms: - Thick, creamy discharge and pressure in the left ear - Pain described as deep within the ear, not involving the tympanic membrane - Ear symptoms most severe at night and upon awakening - Partial improvement with saline nasal spray   Independent Review of Additional Tests or Records:  Reviewed external note from referring PCP, Practice,describing relevant history incorporated into todays evaluation. Previous ct Sinus - all sinuses clear. Significant deviated septum.   PMH/Meds/All/SocHx/FamHx/ROS:  No past medical history on file.   No past surgical history on file.  No family history on file.   Social Connections: Not on file      Current Outpatient Medications:    cetirizine (ZYRTEC) 10 MG tablet, Take 10 mg by mouth  daily., Disp: , Rfl:    escitalopram  (LEXAPRO ) 10 MG tablet,  Take 1 tablet (10 mg total) by mouth daily., Disp: 30 tablet, Rfl: 0   fluticasone  (FLONASE ) 50 MCG/ACT nasal spray, Place 2 sprays into both nostrils daily., Disp: 16 g, Rfl: 6   Hypertonic Nasal Wash (SINUS RINSE BOTTLE KIT) PACK, Place 1 each into the nose daily., Disp: 1 each, Rfl: 1   prazosin (MINIPRESS) 2 MG capsule, Take 2 mg by mouth at bedtime., Disp: , Rfl:    Multiple Vitamin (MULTI-VITAMIN DAILY PO), Take by mouth. (Patient not taking: Reported on 10/13/2024), Disp: , Rfl:    Physical Exam:   BP 135/86 (BP Location: Right Arm, Patient Position: Sitting, Cuff Size: Normal)   Pulse 95   Wt 242 lb (109.8 kg)   BMI 39.66 kg/m   The patient was awake, alert, and appropriate. The external ears were inspected, and otoscopy was performed to evaluate the external auditory canals and tympanic membranes. The nasal cavity and septum were examined for mucosal changes, obstruction, or discharge. The oral cavity and oropharynx were inspected for mucosal lesions, infection, or tonsillar hypertrophy. The neck was palpated for lymphadenopathy, thyroid abnormalities, or other masses. Cranial nerve function was grossly intact.  Pertinent Findings: Physical Exam HEENT: Nose normal, throat normal HEENT: Atraumatic, normocephalic. Ears examined, no abnormalities noted. Nose with deviated septum to the right side, no color drainage. Throat slightly red, likely due to acid reflux. Tonsils and vocal cords appear normal. Cryptic tonsils DNS right Turbinate hypertrophy EAC and TM normal  Seprately Identifiable Procedures:  I personally ordered, reviewed and interpreted the following with the patient today  Given the patient's symptoms and incomplete visualization of critical sinonasal areas with anterior rhinoscopy, a separately performed diagnostic nasal endoscopy procedure is indicated for a complete rhinologic evaluation per American Rhinologic Society recommendations  ([email or web address]-[email or web address]/position-statements)  I personally ordered, reviewed and interpreted the following with the patient today  Procedure Note Diagnostic Nasal Endoscopy CPT CODE -- 68768 - Mod 25  Prior to initiating any procedures, risks/benefits/alternatives were explained to the patient and verbal consent obtained.  Pre-procedure diagnosis: Concern for sinus infection Post-procedure diagnosis: same Indication: See pre-procedure diagnosis and physical exam above Complications: None apparent EBL: 0 mL Anesthesia: Lidocaine 4% and topical decongestant was topically sprayed in each nasal cavity  Description of Procedure:  Patient was identified. A flexible fiberoptic endoscope was utilized to evaluate the sinonasal cavities, mucosa, sinus ostia and turbinates and septum.  Overall, signs of mucosal inflammation are noted.  Also noted are right DNS.  No mucopurulence, polyps, or masses noted.   Right Middle meatus: congested Right SE Recess: clear Left MM: congested Left SE Recess: clear Photodocumentation was obtained.  Procedure Note Pre-procedure diagnosis:  odynophagia and throat pain Post-procedure diagnosis: Same Procedure: Transnasal Fiberoptic Laryngoscopy, CPT 31575 - Mod 25 Indication: odynophagia and throat pain Complications: None apparent EBL: 0 mL  The procedure was undertaken to further evaluate the patient's complaint of throat pain and pain with swalllowing, with mirror exam inadequate for appropriate examination due to gag reflex and poor patient tolerance  Procedure:  Patient was identified as correct patient. Verbal consent was obtained. The nose was sprayed with oxymetazoline and 4% lidocaine. The The flexible laryngoscope was passed through the nose to view the nasal cavity, pharynx (oropharynx, hypopharynx) and larynx.  The larynx was examined at rest and during multiple phonatory tasks. Documentation was obtained and reviewed with patient. The  scope was removed. The patient tolerated the procedure  well.  Findings: The nasal cavity and nasopharynx did not reveal any masses or lesions, mucosa appeared to be without obvious lesions. The tongue base, pharyngeal walls, piriform sinuses, vallecula, epiglottis and postcricoid region are normal in appearance EXCEPT: mild interarytenoid edema and erythema. The visualized portion of the subglottis and proximal trachea is widely patent. The vocal folds are mobile bilaterally. There are no lesions on the free edge of the vocal folds nor elsewhere in the larynx worrisome for malignancy.    Electronically signed by: Penne Croak, DO 10/13/2024 1:54 PM    Impression & Plans:  Amber Hale is a 39 y.o. female  No diagnosis found.  - Findings and diagnoses discussed in detail with the patient. - Risks, benefits, and alternatives were reviewed. Through shared decision making, the patient elects to proceed with below.  Assessment & Plan Eustachian tube dysfunction Intermittent otalgia and aural pressure due to Eustachian tube dysfunction. Audiometry normal, no acute otitis or mass lesion. - Reassured regarding normal audiometry and absence of concerning findings.  Tonsillolithiasis Tonsillolithiasis without current tonsilloliths. Tonsillectomy discouraged due to significant morbidity and risk in adults. - no present on todays exam - Discussed ongoing use of chlorhexidine  rinse and manual expression. - Offered prescription for additional chlorhexidine  rinse as needed. - Counseled against tonsillectomy unless absolutely necessary.  Deviated nasal septum Right-sided septal deviation noted on nasal endoscopy, contributing to possible nasal congestion. - Documented right-sided septal deviation on nasal endoscopy.  Nasal congestion with turbinate hypertrophy Persistent nasal congestion without acute rhinosinusitis or significant obstruction. Current regimen includes saline irrigation and  fluticasone  nasal spray. - Advised increasing fluticasone  nasal spray to twice daily. - Educated on use of saline spray up to hourly during severe congestion. - Reinforced continued use of saline irrigation and fluticasone .  Seasonal allergic rhinitis Seasonal allergic rhinitis well-managed on daily cetirizine and fluticasone  nasal spray. - Reinforced continued use of daily cetirizine and fluticasone  nasal spray, increase to BID.  Hand wrtten rx and instructions given to staff  - Orders placed: No orders of the defined types were placed in this encounter.  - Medications prescribed/continued/adjusted:  No orders of the defined types were placed in this encounter.  - Education materials provided to the patient. - [facility] P   Thank you for allowing me the opportunity to care for your patient. Please do not hesitate to contact me should you have any other questions.  Sincerely, Penne Croak, DO Otolaryngologist (ENT) [facility] Phone: [phone] Fax: [phone]  10/13/2024, 9:21 AM
